# Patient Record
Sex: Male | Born: 1995 | Race: Black or African American | Hispanic: No | Marital: Single | Smoking: Current some day smoker
Health system: Southern US, Community
[De-identification: ages and names within clinical notes are randomized; demographics above are authoritative.]

---

## 2016-08-27 ENCOUNTER — Encounter (HOSPITAL_BASED_OUTPATIENT_CLINIC_OR_DEPARTMENT_OTHER): Payer: Self-pay | Admitting: *Deleted

## 2016-08-27 ENCOUNTER — Emergency Department (HOSPITAL_BASED_OUTPATIENT_CLINIC_OR_DEPARTMENT_OTHER)
Admission: EM | Admit: 2016-08-27 | Discharge: 2016-08-27 | Disposition: A | Discharge status: Home / Self Care | Payer: No Typology Code available for payment source | Attending: Emergency Medicine | Admitting: Emergency Medicine

## 2016-08-27 DIAGNOSIS — Y92410 Unspecified street and highway as the place of occurrence of the external cause: Secondary | ICD-10-CM | POA: Insufficient documentation

## 2016-08-27 DIAGNOSIS — F17200 Nicotine dependence, unspecified, uncomplicated: Secondary | ICD-10-CM | POA: Diagnosis not present

## 2016-08-27 DIAGNOSIS — Y939 Activity, unspecified: Secondary | ICD-10-CM | POA: Insufficient documentation

## 2016-08-27 DIAGNOSIS — Y999 Unspecified external cause status: Secondary | ICD-10-CM | POA: Insufficient documentation

## 2016-08-27 DIAGNOSIS — Z041 Encounter for examination and observation following transport accident: Secondary | ICD-10-CM | POA: Insufficient documentation
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
## 2018-02-05 ENCOUNTER — Encounter (HOSPITAL_COMMUNITY): Payer: Self-pay | Admitting: Emergency Medicine

## 2018-02-05 ENCOUNTER — Emergency Department (HOSPITAL_COMMUNITY): Payer: Self-pay

## 2018-02-05 ENCOUNTER — Other Ambulatory Visit: Payer: Self-pay

## 2018-02-05 ENCOUNTER — Emergency Department (HOSPITAL_COMMUNITY)
Admission: EM | Admit: 2018-02-05 | Discharge: 2018-02-05 | Disposition: A | Discharge status: Home / Self Care | Payer: Self-pay | Attending: Emergency Medicine | Admitting: Emergency Medicine

## 2018-02-05 DIAGNOSIS — J069 Acute upper respiratory infection, unspecified: Secondary | ICD-10-CM | POA: Insufficient documentation

## 2018-02-05 DIAGNOSIS — F17210 Nicotine dependence, cigarettes, uncomplicated: Secondary | ICD-10-CM | POA: Insufficient documentation

## 2018-02-05 LAB — GROUP A STREP BY PCR: Group A Strep by PCR: NOT DETECTED

## 2018-02-05 MED ORDER — IBUPROFEN 800 MG PO TABS
800.0000 mg | ORAL_TABLET | Freq: Once | ORAL | Status: AC
  Administered 2018-02-05: 800 mg via ORAL
  Filled 2018-02-05: qty 1

## 2018-02-05 NOTE — ED Provider Notes (Signed)
[facility]-EMERGENCY DEPT Provider Note   CSN: [id number] Arrival date & time: 02/05/18  1049     History   Chief Complaint Chief Complaint  Patient presents with  . Cough  . Sore Throat  . Shortness of Breath    HPI Darren Holland is a 22 y.o. male.  The history is provided by the patient.  URI   This is a new problem. The current episode started more than 2 days ago. The problem has not changed since onset.There has been no fever. The fever has been present for less than 1 day. Associated symptoms include congestion, sinus pain, sore throat and cough. Pertinent negatives include no chest pain, no abdominal pain, no diarrhea, no nausea, no vomiting, no dysuria, no ear pain, no rhinorrhea, no joint swelling, no neck pain, no rash and no wheezing. Treatments tried: cough medicine. The treatment provided mild relief.    History reviewed. No pertinent past medical history.  There are no active problems to display for this patient.   History reviewed. No pertinent surgical history.      Home Medications    Prior to Admission medications   Not on File    Family History No family history on file.  Social History Social History   Tobacco Use  . Smoking status: Current Every Day Smoker  . Smokeless tobacco: Never Used  Substance Use Topics  . Alcohol use: No  . Drug use: Not on file     Allergies   Patient has no known allergies.   Review of Systems Review of Systems  Constitutional: Negative for chills and fever.  HENT: Positive for congestion, sinus pressure, sinus pain and sore throat. Negative for ear pain, rhinorrhea, trouble swallowing and voice change.   Eyes: Negative for pain and visual disturbance.  Respiratory: Positive for cough. Negative for shortness of breath and wheezing.   Cardiovascular: Negative for chest pain and palpitations.  Gastrointestinal: Negative for abdominal pain, diarrhea, nausea and vomiting.    Genitourinary: Negative for dysuria and hematuria.  Musculoskeletal: Negative for arthralgias, back pain and neck pain.  Skin: Negative for color change and rash.  Neurological: Negative for seizures and syncope.  All other systems reviewed and are negative.    Physical Exam Updated Vital Signs  ED Triage Vitals  Enc Vitals Group     BP 02/05/18 1052 (!) 157/81     Pulse Rate 02/05/18 1052 67     Resp 02/05/18 1052 15     Temp 02/05/18 1052 97.8 F (36.6 C)     Temp Source 02/05/18 1052 Oral     SpO2 02/05/18 1052 97 %     Weight 02/05/18 1053 240 lb (108.9 kg)     Height 02/05/18 1053 5\' 11"  (1.803 m)     Head Circumference --      Peak Flow --      Pain Score 02/05/18 1056 0     Pain Loc --      Pain Edu? --      Excl. in GC? --     Physical Exam  Constitutional: He appears well-developed and well-nourished.  HENT:  Head: Normocephalic and atraumatic.  Right Ear: Tympanic membrane normal.  Left Ear: Tympanic membrane normal.  Mouth/Throat: Uvula is midline and mucous membranes are normal. Posterior oropharyngeal erythema present. No oropharyngeal exudate, posterior oropharyngeal edema or tonsillar abscesses. No tonsillar exudate.  Eyes: Pupils are equal, round, and reactive to light. Conjunctivae and EOM are normal.  Neck:  Neck supple.  Cardiovascular: Normal rate, regular rhythm, normal heart sounds and intact distal pulses.  No murmur heard. Pulmonary/Chest: Effort normal and breath sounds normal. No respiratory distress.  Abdominal: Soft. Bowel sounds are normal. There is no tenderness.  Musculoskeletal: He exhibits no edema.  Neurological: He is alert.  Skin: Skin is warm and dry. Capillary refill takes less than 2 seconds.  Psychiatric: He has a normal mood and affect.  Nursing note and vitals reviewed.    ED Treatments / Results  Labs (all labs ordered are listed, but only abnormal results are displayed) Labs Reviewed  GROUP A STREP BY PCR     EKG None  Radiology Dg Chest 2 View  Result Date: 02/05/2018 CLINICAL DATA:  Cough and sore throat.  Shortness of breath. EXAM: CHEST - 2 VIEW COMPARISON:  None. FINDINGS: Lungs are clear. Heart size and pulmonary vascularity are normal. No adenopathy. No bone lesions. IMPRESSION: No edema or consolidation. Electronically Signed   By: Bretta Bang III M.D.   On: 02/05/2018 11:48    Procedures Procedures (including critical care time)  Medications Ordered in ED Medications  ibuprofen (ADVIL,MOTRIN) tablet 800 mg (800 mg Oral Given 02/05/18 1110)     Initial Impression / Assessment and Plan / ED Course  I have reviewed the triage vital signs and the nursing notes.  Pertinent labs & imaging results that were available during my care of the patient were reviewed by me and considered in my medical decision making (see chart for details).     Darren Holland is a 22 year old male with no significant medical history who presents to the ED with URI type symptoms.  Patient with normal vitals.  No fever.  Patient with cough, congestion for the last several days.  Has had a sore throat.  Denies any ear pain, abdominal pain, urinary symptoms.  Has felt feverish, chills but not currently.  Overall exam is unremarkable.  There is some mild erythema of the posterior oropharynx and will check for strep test.  Lung sounds are clear but given length of symptoms we will test for pneumonia.  Suspect ongoing viral process.  Patient given Tylenol.  Strep negative.  Chest x-ray without any signs of pneumonia, no pneumothorax, no pleural effusion.  Suspect viral process.  Discharged from ED in good condition.  Recommend follow-up with primary care doctor and told to return to the ED if symptoms worsen.  Final Clinical Impressions(s) / ED Diagnoses   Final diagnoses:  Upper respiratory tract infection, unspecified type    ED Discharge Orders    None       Virgina Norfolk, DO 02/05/18  1211

## 2018-02-05 NOTE — ED Notes (Signed)
Pt says "he has something to do" and wants to leave.

## 2018-02-05 NOTE — ED Triage Notes (Signed)
Patient arrives with c/o cough and sore throat x1 week. Patient taking OTC medications, patient states cough gets worse when talking. Denies fever, + productive cough (green), +SOB, +nasal congestion.

## 2018-07-30 ENCOUNTER — Other Ambulatory Visit: Payer: Self-pay

## 2018-07-30 ENCOUNTER — Emergency Department (HOSPITAL_BASED_OUTPATIENT_CLINIC_OR_DEPARTMENT_OTHER): Payer: Self-pay

## 2018-07-30 ENCOUNTER — Encounter (HOSPITAL_BASED_OUTPATIENT_CLINIC_OR_DEPARTMENT_OTHER): Payer: Self-pay | Admitting: *Deleted

## 2018-07-30 ENCOUNTER — Emergency Department (HOSPITAL_BASED_OUTPATIENT_CLINIC_OR_DEPARTMENT_OTHER)
Admission: EM | Admit: 2018-07-30 | Discharge: 2018-07-30 | Disposition: A | Discharge status: Home / Self Care | Payer: Self-pay | Attending: Emergency Medicine | Admitting: Emergency Medicine

## 2018-07-30 DIAGNOSIS — R0789 Other chest pain: Secondary | ICD-10-CM

## 2018-07-30 DIAGNOSIS — Z87891 Personal history of nicotine dependence: Secondary | ICD-10-CM | POA: Insufficient documentation

## 2018-07-30 DIAGNOSIS — R0989 Other specified symptoms and signs involving the circulatory and respiratory systems: Secondary | ICD-10-CM | POA: Insufficient documentation

## 2018-07-30 DIAGNOSIS — R0981 Nasal congestion: Secondary | ICD-10-CM | POA: Insufficient documentation

## 2018-07-30 MED ORDER — CETIRIZINE HCL 10 MG PO TABS: 10.0000 mg | ORAL_TABLET | Freq: Every day | ORAL | 0 refills | Status: AC

## 2018-07-30 NOTE — ED Triage Notes (Signed)
Pt reports he woke with chest pain this morning, felt hot, and also had a "runny nose". States pain is still present, mid-chest, not changed by movement or breathing

## 2018-07-30 NOTE — ED Provider Notes (Signed)
[facility] EMERGENCY DEPARTMENT Provider Note   CSN: [id number] Arrival date & time: 07/30/18  1929    History   Chief Complaint Chief Complaint  Patient presents with  . Chest Pain    HPI Darren Holland is a 23 y.o. male.     The history is provided by the patient. No language interpreter was used.  Chest Pain   Darren Holland is a 23 y.o. male who presents to the Emergency Department complaining of chest pain. He presents to the emergency department for evaluation of runny nose and chest pain. He works third shift and was in his routine state of health when he came home from work this morning. He went to bed at 10 AM and when he awoke at 6 PM he had a runny nose and central chest discomfort that he described as a cold in his chest. He denies any fevers, coughing, shortness of breath, nausea, vomiting, diaphoresis, leg swelling or pain. He states that overall his chest discomfort is gone. He denies any medical problems and takes no medications. He thinks he may have some seasonal allergies. History reviewed. No pertinent past medical history.  There are no active problems to display for this patient.   History reviewed. No pertinent surgical history.      Home Medications    Prior to Admission medications   Medication Sig Start Date End Date Taking? Authorizing Provider  cetirizine (ZYRTEC) 10 MG tablet Take 1 tablet (10 mg total) by mouth daily. 07/30/18   Tilden Fossa, MD    Family History No family history on file.  Social History Social History   Tobacco Use  . Smoking status: Former Games developer  . Smokeless tobacco: Never Used  Substance Use Topics  . Alcohol use: Not Currently  . Drug use: Never     Allergies   Patient has no known allergies.   Review of Systems Review of Systems  Cardiovascular: Positive for chest pain.  All other systems reviewed and are negative.    Physical Exam Updated Vital Signs BP 123/65 (BP Location: Left  Arm)   Pulse 70   Temp 98.4 F (36.9 C) (Oral)   Resp 16   Ht 6' (1.829 m)   Wt 111.1 kg   SpO2 98%   BMI 33.23 kg/m   Physical Exam Vitals signs and nursing note reviewed.  Constitutional:      Appearance: He is well-developed.  HENT:     Head: Normocephalic and atraumatic.     Nose:     Comments: Boggy and edematous nasal mucosa    Mouth/Throat:     Mouth: Mucous membranes are moist.  Cardiovascular:     Rate and Rhythm: Normal rate and regular rhythm.     Heart sounds: No murmur.  Pulmonary:     Effort: Pulmonary effort is normal. No respiratory distress.     Breath sounds: Normal breath sounds.  Abdominal:     Palpations: Abdomen is soft.     Tenderness: There is no abdominal tenderness. There is no guarding or rebound.  Musculoskeletal:        General: No swelling or tenderness.  Skin:    General: Skin is warm and dry.  Neurological:     Mental Status: He is alert and oriented to person, place, and time.  Psychiatric:        Behavior: Behavior normal.      ED Treatments / Results  Labs (all labs ordered are listed, but only abnormal results  are displayed) Labs Reviewed - No data to display  EKG None  Radiology Dg Chest 2 View  Result Date: 07/30/2018 CLINICAL DATA:  Chest pain EXAM: CHEST - 2 VIEW COMPARISON:  02/05/2018 FINDINGS: The heart size and mediastinal contours are within normal limits. Both lungs are clear. The visualized skeletal structures are unremarkable. IMPRESSION: No active cardiopulmonary disease. Electronically Signed   By: Alcide Clever M.D.   On: 07/30/2018 20:34    Procedures Procedures (including critical care time)  Medications Ordered in ED Medications - No data to display   Initial Impression / Assessment and Plan / ED Course  I have reviewed the triage vital signs and the nursing notes.  Pertinent labs & imaging results that were available during my care of the patient were reviewed by me and considered in my medical  decision making (see chart for details).        Patient here for evaluation of runny nose and fleeting episode of chest pain that is now resolved. He is in no acute distress in the emergency department. EKG without acute ischemic changes. Presentation is not consistent with ACS, PE, dissection, pneumonia. He does have nasal edema, question seasonal allergies. Will start on Zyrtec. Discussed outpatient follow-up and return precautions. Final Clinical Impressions(s) / ED Diagnoses   Final diagnoses:  Nasal congestion  Atypical chest pain    ED Discharge Orders         Ordered    cetirizine (ZYRTEC) 10 MG tablet  Daily     07/30/18 2053           Tilden Fossa, MD 07/30/18 2126

## 2018-11-14 ENCOUNTER — Emergency Department (HOSPITAL_COMMUNITY): Payer: Self-pay

## 2018-11-14 ENCOUNTER — Other Ambulatory Visit: Payer: Self-pay

## 2018-11-14 ENCOUNTER — Emergency Department (HOSPITAL_COMMUNITY)
Admission: EM | Admit: 2018-11-14 | Discharge: 2018-11-14 | Disposition: A | Discharge status: Home / Self Care | Payer: Self-pay | Attending: Emergency Medicine | Admitting: Emergency Medicine

## 2018-11-14 DIAGNOSIS — M1612 Unilateral primary osteoarthritis, left hip: Secondary | ICD-10-CM | POA: Insufficient documentation

## 2018-11-14 DIAGNOSIS — Z87891 Personal history of nicotine dependence: Secondary | ICD-10-CM | POA: Insufficient documentation

## 2018-11-14 MED ORDER — IBUPROFEN 400 MG PO TABS
600.0000 mg | ORAL_TABLET | Freq: Once | ORAL | Status: AC
  Administered 2018-11-14: 600 mg via ORAL
  Filled 2018-11-14: qty 1

## 2018-11-14 NOTE — ED Notes (Signed)
Patient verbalizes understanding of discharge instructions. Opportunity for questioning and answering were provided. Armband removed by staff , patient discharged from ED. 

## 2018-11-14 NOTE — ED Notes (Signed)
Patient transported to X-ray 
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
## 2021-03-28 ENCOUNTER — Encounter (HOSPITAL_COMMUNITY): Payer: Self-pay

## 2021-03-28 ENCOUNTER — Other Ambulatory Visit: Payer: Self-pay

## 2021-03-28 ENCOUNTER — Ambulatory Visit (HOSPITAL_COMMUNITY)
Admission: EM | Admit: 2021-03-28 | Discharge: 2021-03-28 | Disposition: A | Discharge status: Home / Self Care | Payer: Self-pay | Attending: Medical Oncology | Admitting: Medical Oncology

## 2021-03-28 DIAGNOSIS — J020 Streptococcal pharyngitis: Secondary | ICD-10-CM

## 2021-03-28 LAB — POCT RAPID STREP A, ED / UC: Streptococcus, Group A Screen (Direct): POSITIVE — AB

## 2021-03-28 MED ORDER — CLINDAMYCIN HCL 300 MG PO CAPS: 300.0000 mg | ORAL_CAPSULE | Freq: Three times a day (TID) | ORAL | 0 refills | Status: AC

## 2021-03-28 NOTE — ED Provider Notes (Signed)
[facility]    CSN: [id number] Arrival date & time: 03/28/21  8366      History   Chief Complaint Chief Complaint  Patient presents with   Sore Throat    HPI Darren Holland is a 25 y.o. male.   HPI  Sore Throat: Pt reports that for the past few hours he has had a sore throat with what he recalls as white patches on his throat. He also reports a fever, headache. Hurts to swallow but is able to and does not have any trouble breathing. No vomiting, rash or cough. He has tried ibuprofen for symptoms without improvement.   History reviewed. No pertinent past medical history.  There are no problems to display for this patient.   History reviewed. No pertinent surgical history.   Home Medications    Prior to Admission medications   Medication Sig Start Date End Date Taking? Authorizing Provider  cetirizine (ZYRTEC) 10 MG tablet Take 1 tablet (10 mg total) by mouth daily. 07/30/18   Tilden Fossa, MD    Family History History reviewed. No pertinent family history.  Social History Social History   Tobacco Use   Smoking status: Former   Smokeless tobacco: Never  Building services engineer Use: Former  Substance Use Topics   Alcohol use: Not Currently   Drug use: Yes    Types: Marijuana    Comment: daily     Allergies   Patient has no known allergies.   Review of Systems Review of Systems  As stated above in HPI Physical Exam Triage Vital Signs ED Triage Vitals  Enc Vitals Group     BP 03/28/21 1132 (!) 162/90     Pulse Rate 03/28/21 1132 79     Resp 03/28/21 1132 18     Temp 03/28/21 1132 99.8 F (37.7 C)     Temp Source 03/28/21 1132 Oral     SpO2 03/28/21 1132 94 %     Weight --      Height --      Head Circumference --      Peak Flow --      Pain Score 03/28/21 1133 10     Pain Loc --      Pain Edu? --      Excl. in GC? --    No data found.  Updated Vital Signs BP (!) 162/90 (BP Location: Right Arm)   Pulse 79   Temp 99.8 F  (37.7 C) (Oral)   Resp 18   SpO2 94%   Physical Exam Vitals and nursing note reviewed.  Constitutional:      General: He is not in acute distress.    Appearance: He is well-developed. He is not ill-appearing, toxic-appearing or diaphoretic.  HENT:     Head: Normocephalic and atraumatic.     Right Ear: Tympanic membrane normal. No middle ear effusion. Tympanic membrane is not erythematous.     Left Ear: Tympanic membrane normal.  No middle ear effusion. Tympanic membrane is not erythematous.     Nose: No congestion or rhinorrhea.     Mouth/Throat:     Mouth: Mucous membranes are moist.     Pharynx: Oropharynx is clear. Uvula midline. No oropharyngeal exudate, posterior oropharyngeal erythema or uvula swelling.     Tonsils: Tonsillar exudate and tonsillar abscess (possible left) present. 2+ on the right. 2+ on the left.  Eyes:     Conjunctiva/sclera: Conjunctivae normal.     Pupils: Pupils are equal,  round, and reactive to light.  Cardiovascular:     Rate and Rhythm: Normal rate and regular rhythm.     Heart sounds: Normal heart sounds.  Pulmonary:     Effort: Pulmonary effort is normal.     Breath sounds: Normal breath sounds.  Musculoskeletal:     Cervical back: Normal range of motion and neck supple.  Lymphadenopathy:     Cervical: Cervical adenopathy present.  Skin:    General: Skin is warm.  Neurological:     Mental Status: He is alert and oriented to person, place, and time.     UC Treatments / Results  Labs (all labs ordered are listed, but only abnormal results are displayed) Labs Reviewed - No data to display  EKG   Radiology No results found.  Procedures Procedures (including critical care time)  Medications Ordered in UC Medications - No data to display  Initial Impression / Assessment and Plan / UC Course  I have reviewed the triage vital signs and the nursing notes.  Pertinent labs & imaging results that were available during my care of the  patient were reviewed by me and considered in my medical decision making (see chart for details).     New.  I discussed with Dr. Jearld Fenton who is on-call as I do have some concerns that he may develop peritonsillar abscess especially on the left side.  Dr. Lorri Frederick suggested clindamycin and a 24-hour trial as long as he is not having any trouble breathing and no evidence of compromised airway.  Very low threshold for going to the emergency room.  Final Clinical Impressions(s) / UC Diagnoses   Final diagnoses:  None   Discharge Instructions   None    ED Prescriptions   None    PDMP not reviewed this encounter.   Rushie Chestnut, [place] 03/28/21 1241

## 2021-03-28 NOTE — ED Triage Notes (Signed)
Pt c/o sore throat with white patches and fever. Sx started last pm.

## 2021-03-28 NOTE — Discharge Instructions (Addendum)
If you have any trouble breathing, swallowing or you cannot tolerate your medication please go directly to the ER or call 911

## 2021-04-06 ENCOUNTER — Emergency Department (HOSPITAL_COMMUNITY): Payer: Self-pay

## 2021-04-06 ENCOUNTER — Encounter (HOSPITAL_COMMUNITY): Payer: Self-pay | Admitting: Emergency Medicine

## 2021-04-06 ENCOUNTER — Other Ambulatory Visit: Payer: Self-pay

## 2021-04-06 ENCOUNTER — Emergency Department (HOSPITAL_COMMUNITY)
Admission: EM | Admit: 2021-04-06 | Discharge: 2021-04-07 | Disposition: A | Discharge status: Home / Self Care | Payer: Self-pay | Attending: Emergency Medicine | Admitting: Emergency Medicine

## 2021-04-06 DIAGNOSIS — S4991XA Unspecified injury of right shoulder and upper arm, initial encounter: Secondary | ICD-10-CM | POA: Insufficient documentation

## 2021-04-06 DIAGNOSIS — Z87891 Personal history of nicotine dependence: Secondary | ICD-10-CM | POA: Insufficient documentation

## 2021-04-06 DIAGNOSIS — E669 Obesity, unspecified: Secondary | ICD-10-CM | POA: Insufficient documentation

## 2021-04-06 DIAGNOSIS — S199XXA Unspecified injury of neck, initial encounter: Secondary | ICD-10-CM | POA: Insufficient documentation

## 2021-04-06 DIAGNOSIS — S4992XA Unspecified injury of left shoulder and upper arm, initial encounter: Secondary | ICD-10-CM | POA: Insufficient documentation

## 2021-04-06 DIAGNOSIS — S3992XA Unspecified injury of lower back, initial encounter: Secondary | ICD-10-CM | POA: Insufficient documentation

## 2021-04-06 DIAGNOSIS — Y92410 Unspecified street and highway as the place of occurrence of the external cause: Secondary | ICD-10-CM | POA: Insufficient documentation
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
## 2021-04-07 MED ORDER — METHOCARBAMOL 500 MG PO TABS: 500.0000 mg | ORAL_TABLET | Freq: Two times a day (BID) | ORAL | 0 refills | Status: DC

## 2021-04-07 MED ORDER — IBUPROFEN 800 MG PO TABS: 800.0000 mg | ORAL_TABLET | Freq: Three times a day (TID) | ORAL | 0 refills | Status: AC
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
## 2021-04-09 ENCOUNTER — Encounter: Payer: Self-pay | Admitting: Emergency Medicine

## 2021-04-09 ENCOUNTER — Other Ambulatory Visit: Payer: Self-pay

## 2021-04-09 ENCOUNTER — Ambulatory Visit
Admission: EM | Admit: 2021-04-09 | Discharge: 2021-04-09 | Disposition: A | Discharge status: Home / Self Care | Payer: Self-pay

## 2021-04-09 DIAGNOSIS — M545 Low back pain, unspecified: Secondary | ICD-10-CM
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
## 2022-02-23 ENCOUNTER — Ambulatory Visit
Admission: EM | Admit: 2022-02-23 | Discharge: 2022-02-23 | Disposition: A | Discharge status: Home / Self Care | Payer: Self-pay | Attending: Internal Medicine | Admitting: Internal Medicine

## 2022-02-23 DIAGNOSIS — J029 Acute pharyngitis, unspecified: Secondary | ICD-10-CM | POA: Insufficient documentation

## 2022-02-23 DIAGNOSIS — J039 Acute tonsillitis, unspecified: Secondary | ICD-10-CM | POA: Insufficient documentation

## 2022-02-23 DIAGNOSIS — K047 Periapical abscess without sinus: Secondary | ICD-10-CM | POA: Insufficient documentation

## 2022-02-23 LAB — POCT RAPID STREP A (OFFICE): Rapid Strep A Screen: NEGATIVE

## 2022-02-23 MED ORDER — AMOXICILLIN-POT CLAVULANATE 875-125 MG PO TABS: 1.0000 | ORAL_TABLET | Freq: Two times a day (BID) | ORAL | 0 refills | Status: DC

## 2022-02-23 NOTE — ED Triage Notes (Signed)
Pt presents for eval of gingivitis.  Reports after brushing teeth for the last week his gums are bleeding and feels like they are separating from teeth.  Reports abscess on roof of mouth, in back, x 4 days with swollen lymph nodes.  No fevers.

## 2022-02-23 NOTE — ED Provider Notes (Signed)
[facility]    CSN: [id number] Arrival date & time: 02/23/22  5361      History   Chief Complaint Chief Complaint  Patient presents with   Dental Problem    HPI Darren Holland is a 26 y.o. male.   Patient presents with dental pain, sore throat, swollen lymph nodes.  Patient reports that he is having dental pain with majority being in the front bottom teeth that has been present for multiple days.  He also started experiencing sore throat and possible enlarged tonsils about 4 days ago as well.  Right sided swollen lymph node started around the same time as sore throat.  He denies any fever, nasal congestion, cough.  Denies any known sick contacts.  He has not taken any medications for symptoms.  He reports that he has attempted to call dentist with no success.     History reviewed. No pertinent past medical history.  There are no problems to display for this patient.   History reviewed. No pertinent surgical history.     Home Medications    Prior to Admission medications   Medication Sig Start Date End Date Taking? Authorizing Provider  amoxicillin-clavulanate (AUGMENTIN) 875-125 MG tablet Take 1 tablet by mouth every 12 (twelve) hours. 02/23/22  Yes Laniesha Das, Acie Fredrickson, FNP  cetirizine (ZYRTEC) 10 MG tablet Take 1 tablet (10 mg total) by mouth daily. 07/30/18   Tilden Fossa, MD  ibuprofen (ADVIL) 800 MG tablet Take 1 tablet (800 mg total) by mouth 3 (three) times daily. 04/07/21   Smoot, Shawn Route, PA-C  methocarbamol (ROBAXIN) 500 MG tablet Take 1 tablet (500 mg total) by mouth 2 (two) times daily. 04/07/21   Smoot, Shawn Route, PA-C    Family History History reviewed. No pertinent family history.  Social History Social History   Tobacco Use   Smoking status: Some Days    Types: Cigarettes   Smokeless tobacco: Never  Vaping Use   Vaping Use: Former  Substance Use Topics   Alcohol use: Not Currently   Drug use: Yes    Types: Marijuana    Comment:  daily     Allergies   Patient has no known allergies.   Review of Systems Review of Systems Per HPI  Physical Exam Triage Vital Signs ED Triage Vitals  Enc Vitals Group     BP 02/23/22 0944 (!) 148/92     Pulse Rate 02/23/22 0944 78     Resp 02/23/22 0944 18     Temp 02/23/22 0944 98.7 F (37.1 C)     Temp Source 02/23/22 0944 Oral     SpO2 02/23/22 0944 96 %     Weight --      Height --      Head Circumference --      Peak Flow --      Pain Score 02/23/22 0941 9     Pain Loc --      Pain Edu? --      Excl. in GC? --    No data found.  Updated Vital Signs BP (!) 148/92 (BP Location: Left Arm)   Pulse 78   Temp 98.7 F (37.1 C) (Oral)   Resp 18   SpO2 96%   Visual Acuity Right Eye Distance:   Left Eye Distance:   Bilateral Distance:    Right Eye Near:   Left Eye Near:    Bilateral Near:     Physical Exam Constitutional:      General:  He is not in acute distress.    Appearance: Normal appearance. He is not toxic-appearing or diaphoretic.  HENT:     Head: Normocephalic and atraumatic.     Right Ear: Tympanic membrane and ear canal normal.     Left Ear: Tympanic membrane and ear canal normal.     Nose: No congestion.     Mouth/Throat:     Mouth: Mucous membranes are moist.     Dentition: Dental tenderness and gingival swelling present.     Pharynx: Oropharyngeal exudate and posterior oropharyngeal erythema present.     Tonsils: Tonsillar exudate present. 2+ on the right. 2+ on the left.      Comments: Patient has gingival swelling that is mild throughout but also has erythema and more pronounced gingival swelling in front bottom teeth. Eyes:     Extraocular Movements: Extraocular movements intact.     Conjunctiva/sclera: Conjunctivae normal.     Pupils: Pupils are equal, round, and reactive to light.  Cardiovascular:     Rate and Rhythm: Normal rate and regular rhythm.     Pulses: Normal pulses.     Heart sounds: Normal heart sounds.  Pulmonary:      Effort: Pulmonary effort is normal. No respiratory distress.     Breath sounds: Normal breath sounds. No stridor. No wheezing, rhonchi or rales.  Abdominal:     General: Abdomen is flat. Bowel sounds are normal.     Palpations: Abdomen is soft.  Musculoskeletal:        General: Normal range of motion.     Cervical back: Normal range of motion.  Lymphadenopathy:     Cervical: Cervical adenopathy present.     Right cervical: Superficial cervical adenopathy present.  Skin:    General: Skin is warm and dry.  Neurological:     General: No focal deficit present.     Mental Status: He is alert and oriented to person, place, and time. Mental status is at baseline.  Psychiatric:        Mood and Affect: Mood normal.        Behavior: Behavior normal.      UC Treatments / Results  Labs (all labs ordered are listed, but only abnormal results are displayed) Labs Reviewed  CULTURE, GROUP A STREP [facility])  POCT RAPID STREP A (OFFICE)    EKG   Radiology No results found.  Procedures Procedures (including critical care time)  Medications Ordered in UC Medications - No data to display  Initial Impression / Assessment and Plan / UC Course  I have reviewed the triage vital signs and the nursing notes.  Pertinent labs & imaging results that were available during my care of the patient were reviewed by me and considered in my medical decision making (see chart for details).     Patient has a dental infection.  Patient also has acute tonsillitis but rapid strep test was negative.  I am still highly suspicious for strep throat given acute tonsillitis and exudate noted on exam.   Therefore, will opt to treat with Augmentin antibiotic as this will cover for dental infection and acute tonsillitis.  There is low suspicion for mono testing so this testing was deferred. No signs of peritonsillar abscess on exam. Patient provided with dental resources paperwork and advised to follow-up with  dentist for further evaluation and management of dental infection as well.  He was advised of supportive care and symptom management.  Discussed return precautions.  Patient verbalized understanding and was  agreeable with plan. Final Clinical Impressions(s) / UC Diagnoses   Final diagnoses:  Acute tonsillitis, unspecified etiology  Sore throat  Dental infection     Discharge Instructions      I have prescribed you an antibiotic for enlarged tonsils and dental infection.  Please take this medication with food.  Follow-up with dentist for further evaluation and management.    ED Prescriptions     Medication Sig Dispense Auth. Provider   amoxicillin-clavulanate (AUGMENTIN) 875-125 MG tablet Take 1 tablet by mouth every 12 (twelve) hours. 14 tablet [place], Acie Fredrickson, [place]      PDMP not reviewed this encounter.   Gustavus Bryant, [place] 02/23/22 1018

## 2022-02-23 NOTE — Discharge Instructions (Signed)
I have prescribed you an antibiotic for enlarged tonsils and dental infection.  Please take this medication with food.  Follow-up with dentist for further evaluation and management.

## 2022-02-24 LAB — CULTURE, GROUP A STREP (THRC)
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
## 2023-04-30 ENCOUNTER — Ambulatory Visit
Admission: EM | Admit: 2023-04-30 | Discharge: 2023-04-30 | Disposition: A | Discharge status: Home / Self Care | Payer: 59 | Attending: Family Medicine | Admitting: Family Medicine

## 2023-04-30 DIAGNOSIS — Z113 Encounter for screening for infections with a predominantly sexual mode of transmission: Secondary | ICD-10-CM | POA: Diagnosis not present

## 2023-04-30 DIAGNOSIS — Z202 Contact with and (suspected) exposure to infections with a predominantly sexual mode of transmission: Secondary | ICD-10-CM | POA: Diagnosis not present

## 2023-04-30 NOTE — ED Provider Notes (Signed)
[facility]    CSN: [id number] Arrival date & time: 04/30/23  1346      History   Chief Complaint Chief Complaint  Patient presents with   SEXUALLY TRANSMITTED DISEASE    Testing    HPI Darren Holland is a 27 y.o. male.   Patient is here for STD testing.  He had unprotected intercourse recently, and was told his partner may have an STD.  He is not having any symptoms currently.  He declinse blood work today.        History reviewed. No pertinent past medical history.  There are no active problems to display for this patient.   History reviewed. No pertinent surgical history.     Home Medications    Prior to Admission medications   Medication Sig Start Date End Date Taking? Authorizing Provider  amoxicillin-clavulanate (AUGMENTIN) 875-125 MG tablet Take 1 tablet by mouth every 12 (twelve) hours. 02/23/22   Gustavus Bryant, FNP  cetirizine (ZYRTEC) 10 MG tablet Take 1 tablet (10 mg total) by mouth daily. 07/30/18   Tilden Fossa, MD  ibuprofen (ADVIL) 800 MG tablet Take 1 tablet (800 mg total) by mouth 3 (three) times daily. 04/07/21   Smoot, Shawn Route, PA-C  methocarbamol (ROBAXIN) 500 MG tablet Take 1 tablet (500 mg total) by mouth 2 (two) times daily. 04/07/21   Smoot, Shawn Route, PA-C    Family History History reviewed. No pertinent family history.  Social History Social History   Tobacco Use   Smoking status: Some Days    Types: Cigarettes   Smokeless tobacco: Never  Vaping Use   Vaping status: Former  Substance Use Topics   Alcohol use: Yes    Comment: Occassionally.   Drug use: Yes    Types: Marijuana    Comment: daily     Allergies   Patient has no known allergies.   Review of Systems Review of Systems  Constitutional: Negative.   HENT: Negative.    Respiratory: Negative.    Cardiovascular: Negative.   Gastrointestinal: Negative.   Genitourinary: Negative.   Musculoskeletal: Negative.      Physical Exam Triage Vital  Signs ED Triage Vitals  Encounter Vitals Group     BP --      Systolic BP Percentile --      Diastolic BP Percentile --      Pulse --      Resp --      Temp --      Temp src --      SpO2 --      Weight 04/30/23 1544 264 lb 15.9 oz (120.2 kg)     Height 04/30/23 1544 6' (1.829 m)     Head Circumference --      Peak Flow --      Pain Score 04/30/23 1540 0     Pain Loc --      Pain Education --      Exclude from Growth Chart --    No data found.  Updated Vital Signs Ht 6' (1.829 m)   Wt 120.2 kg   BMI 35.94 kg/m   Visual Acuity Right Eye Distance:   Left Eye Distance:   Bilateral Distance:    Right Eye Near:   Left Eye Near:    Bilateral Near:     Physical Exam Constitutional:      Appearance: Normal appearance.  Cardiovascular:     Rate and Rhythm: Normal rate and regular rhythm.  Pulmonary:  Effort: Pulmonary effort is normal.     Breath sounds: Normal breath sounds.  Neurological:     General: No focal deficit present.     Mental Status: He is alert.  Psychiatric:        Mood and Affect: Mood normal.      UC Treatments / Results  Labs (all labs ordered are listed, but only abnormal results are displayed) Labs Reviewed  CYTOLOGY, (ORAL, ANAL, URETHRAL) ANCILLARY ONLY    EKG   Radiology No results found.  Procedures Procedures (including critical care time)  Medications Ordered in UC Medications - No data to display  Initial Impression / Assessment and Plan / UC Course  I have reviewed the triage vital signs and the nursing notes.  Pertinent labs & imaging results that were available during my care of the patient were reviewed by me and considered in my medical decision making (see chart for details).   Final Clinical Impressions(s) / UC Diagnoses   Final diagnoses:  Screening for STD (sexually transmitted disease)     Discharge Instructions      You were seen today for STD testing.  Your swab will be resulted tomorrow and if  positive for anything you will be notified.  If you see something over the weekend then please call for any questions.  Avoid intercourse until all results are completed.     ED Prescriptions   None    PDMP not reviewed this encounter.   Jannifer Franklin, MD 04/30/23 1600

## 2023-04-30 NOTE — Discharge Instructions (Signed)
You were seen today for STD testing.  Your swab will be resulted tomorrow and if positive for anything you will be notified.  If you see something over the weekend then please call for any questions.  Avoid intercourse until all results are completed.

## 2023-04-30 NOTE — ED Triage Notes (Signed)
"  About 2 wks ago I slept with this girl, after I slept with her I started hearing rumors that she has or had STI's". "I currently have no symptoms of concern but want testing". No blood work needed.

## 2023-05-03 ENCOUNTER — Telehealth (HOSPITAL_COMMUNITY): Payer: Self-pay

## 2023-05-03 MED ORDER — METRONIDAZOLE 500 MG PO TABS: 2000.0000 mg | ORAL_TABLET | Freq: Once | ORAL | 0 refills | Status: AC

## 2023-05-03 NOTE — Telephone Encounter (Signed)
Per protocol, pt requires tx with metronidazole. Attempted to reach patient x1. Unable to LVM.  Rx sent to pharmacy on file.

## 2023-05-04 LAB — CYTOLOGY, (ORAL, ANAL, URETHRAL) ANCILLARY ONLY
Chlamydia: NEGATIVE
Comment: NEGATIVE
Comment: NEGATIVE
Comment: NORMAL
Neisseria Gonorrhea: NEGATIVE
Trichomonas: POSITIVE — AB

## 2023-05-17 ENCOUNTER — Ambulatory Visit: Payer: 59

## 2023-05-28 ENCOUNTER — Encounter: Payer: Self-pay | Admitting: Emergency Medicine

## 2023-05-28 ENCOUNTER — Ambulatory Visit
Admission: EM | Admit: 2023-05-28 | Discharge: 2023-05-28 | Disposition: A | Discharge status: Home / Self Care | Payer: 59 | Attending: Internal Medicine | Admitting: Internal Medicine

## 2023-05-28 ENCOUNTER — Ambulatory Visit: Admission: EM | Admit: 2023-05-28 | Discharge: 2023-05-28 | Discharge status: Against Medical Advice | Payer: 59

## 2023-05-28 DIAGNOSIS — A599 Trichomoniasis, unspecified: Secondary | ICD-10-CM | POA: Insufficient documentation

## 2023-05-28 DIAGNOSIS — R600 Localized edema: Secondary | ICD-10-CM | POA: Insufficient documentation

## 2023-05-28 DIAGNOSIS — R030 Elevated blood-pressure reading, without diagnosis of hypertension: Secondary | ICD-10-CM | POA: Diagnosis not present

## 2023-05-28 DIAGNOSIS — R8290 Unspecified abnormal findings in urine: Secondary | ICD-10-CM | POA: Insufficient documentation

## 2023-05-28 LAB — POCT URINALYSIS DIP (MANUAL ENTRY)
Bilirubin, UA: NEGATIVE
Blood, UA: NEGATIVE
Glucose, UA: NEGATIVE mg/dL
Ketones, POC UA: NEGATIVE mg/dL
Nitrite, UA: NEGATIVE
Protein Ur, POC: NEGATIVE mg/dL
Spec Grav, UA: >=1.03 — AB (ref 1.010–1.025)
Urobilinogen, UA: 1 U/dL
pH, UA: 6 (ref 5.0–8.0)

## 2023-05-28 MED ORDER — TRIAMTERENE-HCTZ 37.5-25 MG PO TABS: 1.0000 | ORAL_TABLET | Freq: Every day | ORAL | 0 refills | Status: AC

## 2023-05-28 NOTE — ED Provider Notes (Addendum)
[facility]    CSN: [id number] Arrival date & time: 05/28/23  1755      History   Chief Complaint Chief Complaint  Patient presents with   Leg Pain   SEXUALLY TRANSMITTED DISEASE    Testing    HPI Darren Holland is a 28 y.o. male who presents requesting re testing for trich. Has noticed strong urine odor just as he had it before. He denies dysuria.  2- He woke up this am with both his legs swollen. States that he fell asleep when he was playing video games on his recliner and his legs were hanging. Denies leg pain. He does admits he snores. Does not have a PCP    History reviewed. No pertinent past medical history.  There are no active problems to display for this patient.   History reviewed. No pertinent surgical history.     Home Medications    Prior to Admission medications   Medication Sig Start Date End Date Taking? Authorizing Provider  triamterene-hydrochlorothiazide (MAXZIDE-25) 37.5-25 MG tablet Take 1 tablet by mouth daily. For edema 05/28/23  Yes Rodriguez-Southworth, Nettie Elm, PA-C  cetirizine (ZYRTEC) 10 MG tablet Take 1 tablet (10 mg total) by mouth daily. 07/30/18   Tilden Fossa, MD  ibuprofen (ADVIL) 800 MG tablet Take 1 tablet (800 mg total) by mouth 3 (three) times daily. 04/07/21   Smoot, Shawn Route, PA-C    Family History History reviewed. No pertinent family history.  Social History Social History   Tobacco Use   Smoking status: Some Days    Types: Cigarettes   Smokeless tobacco: Never  Vaping Use   Vaping status: Former  Substance Use Topics   Alcohol use: Yes    Comment: Occassionally.   Drug use: Yes    Types: Marijuana    Comment: daily     Allergies   Patient has no known allergies.   Review of Systems Review of Systems As noted in HPI  Physical Exam Triage Vital Signs ED Triage Vitals  Encounter Vitals Group     BP 05/28/23 1940 (!) 162/71     Systolic BP Percentile --      Diastolic BP Percentile --       Pulse Rate 05/28/23 1940 60     Resp 05/28/23 1940 18     Temp 05/28/23 1940 98 F (36.7 C)     Temp Source 05/28/23 1940 Oral     SpO2 05/28/23 1940 97 %     Weight 05/28/23 1939 280 lb (127 kg)     Height 05/28/23 1939 5\' 11"  (1.803 m)     Head Circumference --      Peak Flow --      Pain Score 05/28/23 1935 7     Pain Loc --      Pain Education --      Exclude from Growth Chart --    No data found.  Updated Vital Signs BP (!) 170/66 (BP Location: Right Arm)   Pulse 63   Temp 98 F (36.7 C) (Oral)   Resp 18   Ht 5\' 11"  (1.803 m)   Wt 280 lb (127 kg)   SpO2 97%   BMI 39.05 kg/m   Visual Acuity Right Eye Distance:   Left Eye Distance:   Bilateral Distance:    Right Eye Near:   Left Eye Near:    Bilateral Near:     Physical Exam Vitals and nursing note reviewed.  HENT:  Right Ear: External ear normal.     Left Ear: External ear normal.  Eyes:     General: No scleral icterus.    Conjunctiva/sclera: Conjunctivae normal.  Cardiovascular:     Rate and Rhythm: Normal rate and regular rhythm.     Heart sounds: No murmur heard. Pulmonary:     Effort: Pulmonary effort is normal.     Breath sounds: Normal breath sounds.  Musculoskeletal:        General: Normal range of motion.     Cervical back: Neck supple.  Skin:    General: Skin is warm and dry.     Findings: No erythema or rash.  Neurological:     Mental Status: He is alert.     Gait: Gait normal.  Psychiatric:        Mood and Affect: Mood normal.        Thought Content: Thought content normal.        Judgment: Judgment normal.      UC Treatments / Results  Labs (all labs ordered are listed, but only abnormal results are displayed) Labs Reviewed  POCT URINALYSIS DIP (MANUAL ENTRY) - Abnormal; Notable for the following components:      Result Value   Color, UA other (*)    Clarity, UA cloudy (*)    Spec Grav, UA >=1.030 (*)    Leukocytes, UA Trace (*)    All other components within  normal limits  URINE CULTURE  CYTOLOGY, (ORAL, ANAL, URETHRAL) ANCILLARY ONLY    EKG   Radiology No results found.  Procedures Procedures (including critical care time)  Medications Ordered in UC Medications - No data to display  Initial Impression / Assessment and Plan / UC Course  I have reviewed the triage vital signs and the nursing notes. Pertinent labs results that were available during my care of the patient were reviewed by me and considered in my medical decision making (see chart for details).  Strong urine odor Leg edema more likely from keeping his leg down while sleeping  I placed him on Maxide for a Couple of day. We made him an appointment with PCP here and asked him to make BO diaries to bring to the new PCP. May also need sleep test.  We will call him with when results come back.   Final Clinical Impressions(s) / UC Diagnoses   Final diagnoses:  Trichomonas infection  Edema of both lower legs  Bad odor of urine  Elevated blood pressure reading   Discharge Instructions   None    ED Prescriptions     Medication Sig Dispense Auth. Provider   triamterene-hydrochlorothiazide (MAXZIDE-25) 37.5-25 MG tablet Take 1 tablet by mouth daily. For edema 2 tablet Rodriguez-Southworth, Nettie Elm, PA-C      PDMP not reviewed this encounter.   Garey Ham, PA-C 05/28/23 2056    Rodriguez-Southworth, [place], PA-C 05/28/23 2058

## 2023-05-28 NOTE — ED Triage Notes (Addendum)
Pt noticed bilateral leg swelling this morning from feet to calves. Non-pitting edema. No trauma or injury to area. 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 No pertinent medical history. Denies cardiac or kidney issues.   Pt reports being seen on 12/20 and tested positive for Trichomonas. Pt asks if he can have repeat testing. Denies any new exposures but said he noticed his urine smells foul x1 day.

## 2023-05-28 NOTE — ED Notes (Addendum)
Called 3x by Patient Access for patient to come back in to be seen, Room ready.  No Answer.

## 2023-05-28 NOTE — ED Notes (Signed)
Called x2 by Clinical staff (from waiting area and by phone), No Answer.

## 2023-05-28 NOTE — ED Triage Notes (Signed)
Patient is here for STD testing & Leg pain.  "When I got tested recently I was + for STI and today my urine had an odor so I want that testing done again". No dysuria.   "Also, here for my legs swelling (lower legs). No injury.

## 2023-05-31 LAB — URINE CULTURE
Culture: <10000 — AB
Special Requests: NORMAL

## 2023-06-01 LAB — CYTOLOGY, (ORAL, ANAL, URETHRAL) ANCILLARY ONLY
Chlamydia: NEGATIVE
Comment: NEGATIVE
Comment: NEGATIVE
Comment: NORMAL
Neisseria Gonorrhea: NEGATIVE
Trichomonas: NEGATIVE

## 2023-08-13 ENCOUNTER — Ambulatory Visit (INDEPENDENT_AMBULATORY_CARE_PROVIDER_SITE_OTHER): Payer: 59 | Admitting: Family

## 2023-08-13 VITALS — BP 148/83 | HR 54 | Temp 98.4°F | Ht 71.25 in | Wt 275.0 lb

## 2023-08-13 DIAGNOSIS — Z7689 Persons encountering health services in other specified circumstances: Secondary | ICD-10-CM

## 2023-08-13 DIAGNOSIS — Z0000 Encounter for general adult medical examination without abnormal findings: Secondary | ICD-10-CM

## 2023-08-13 DIAGNOSIS — R030 Elevated blood-pressure reading, without diagnosis of hypertension: Secondary | ICD-10-CM

## 2023-08-13 DIAGNOSIS — Z1329 Encounter for screening for other suspected endocrine disorder: Secondary | ICD-10-CM

## 2023-08-13 DIAGNOSIS — Z1159 Encounter for screening for other viral diseases: Secondary | ICD-10-CM

## 2023-08-13 DIAGNOSIS — Z130 Encounter for screening for diseases of the blood and blood-forming organs and certain disorders involving the immune mechanism: Secondary | ICD-10-CM

## 2023-08-13 DIAGNOSIS — Z13220 Encounter for screening for lipoid disorders: Secondary | ICD-10-CM

## 2023-08-13 DIAGNOSIS — Z13228 Encounter for screening for other metabolic disorders: Secondary | ICD-10-CM

## 2023-08-13 DIAGNOSIS — Z114 Encounter for screening for human immunodeficiency virus [HIV]: Secondary | ICD-10-CM

## 2023-08-13 DIAGNOSIS — Z131 Encounter for screening for diabetes mellitus: Secondary | ICD-10-CM

## 2023-08-13 NOTE — Progress Notes (Signed)
 Subjective:    Darren Holland - 28 y.o. male MRN [id number]  Date of birth: 1995/09/29  HPI  Darren Holland is to establish care and annual physical exam.   Current issues and/or concerns: - States he eats salty foods. He does not exercise outside of his normal routine. He does not complain of red flag symptoms such as but not limited to chest pain, shortness of breath, worst headache of life, nausea/vomiting.  - No further issues/concerns for discussion today.   ROS per HPI    Health Maintenance:  Health Maintenance Due  Topic Date Due   Pneumococcal Vaccine 69-51 Years old (1 of 2 - PCV) Never done   HPV VACCINES (1 - Male 3-dose series) Never done   HIV Screening  Never done   Hepatitis C Screening  Never done   DTaP/Tdap/Td (1 - Tdap) Never done   COVID-19 Vaccine (1 - 2024-25 season) Never done     Past Medical History: There are no active problems to display for this patient.     Social History   reports that he has been smoking cigarettes. He has never used smokeless tobacco. He reports current alcohol use. He reports current drug use. Drug: Marijuana.   Family History  family history is not on file.   Medications: reviewed and updated   Objective:   Physical Exam BP (!) 148/83   Pulse (!) 54   Temp 98.4 F (36.9 C) (Oral)   Ht 5' 11.25" (1.81 m)   Wt 275 lb (124.7 kg)   SpO2 96%   BMI 38.09 kg/m   Physical Exam HENT:     Head: Normocephalic and atraumatic.     Right Ear: Tympanic membrane, ear canal and external ear normal.     Left Ear: Tympanic membrane, ear canal and external ear normal.     Nose: Nose normal.     Mouth/Throat:     Mouth: Mucous membranes are moist.     Pharynx: Oropharynx is clear.  Eyes:     Extraocular Movements: Extraocular movements intact.     Conjunctiva/sclera: Conjunctivae normal.     Pupils: Pupils are equal, round, and reactive to light.  Neck:     Thyroid: No thyroid mass, thyromegaly or thyroid tenderness.   Cardiovascular:     Rate and Rhythm: Bradycardia present.     Pulses: Normal pulses.     Heart sounds: Normal heart sounds.  Pulmonary:     Effort: Pulmonary effort is normal.     Breath sounds: Normal breath sounds.  Abdominal:     General: Bowel sounds are normal.     Palpations: Abdomen is soft.  Genitourinary:    Comments: Patient declined. Musculoskeletal:        General: Normal range of motion.     Right shoulder: Normal.     Left shoulder: Normal.     Right upper arm: Normal.     Left upper arm: Normal.     Right elbow: Normal.     Left elbow: Normal.     Right forearm: Normal.     Left forearm: Normal.     Right wrist: Normal.     Left wrist: Normal.     Right hand: Normal.     Left hand: Normal.     Cervical back: Normal, normal range of motion and neck supple.     Thoracic back: Normal.     Lumbar back: Normal.     Right hip: Normal.  Left hip: Normal.     Right upper leg: Normal.     Left upper leg: Normal.     Right knee: Normal.     Left knee: Normal.     Right lower leg: Normal.     Left lower leg: Normal.     Right ankle: Normal.     Left ankle: Normal.     Right foot: Normal.     Left foot: Normal.  Skin:    General: Skin is warm and dry.     Capillary Refill: Capillary refill takes less than 2 seconds.  Neurological:     General: No focal deficit present.     Mental Status: He is alert and oriented to person, place, and time.  Psychiatric:        Mood and Affect: Mood normal.        Behavior: Behavior normal.       Assessment & Plan:  1. Encounter to establish care (Primary) 2. Annual physical exam - Counseled on 150 minutes of exercise per week as tolerated, healthy eating (including decreased daily intake of saturated fats, cholesterol, added sugars, sodium), STI prevention, and routine healthcare maintenance.  3. Screening for metabolic disorder - Routine screening.  - CMP14+EGFR  4. Screening for deficiency anemia - Routine  screening.  - CBC  5. Diabetes mellitus screening - Routine screening.  - Hemoglobin A1c  6. Screening cholesterol level - Routine screening.  - Lipid panel  7. Thyroid disorder screen - Routine screening.  - TSH  8. Encounter for screening for HIV - Routine screening.  - HIV antibody (with reflex)  9. Need for hepatitis C screening test - Routine screening.  - Hepatitis C Antibody  10. Elevated blood pressure reading - Blood pressure not at goal during today's visit. Patient asymptomatic without chest pressure, chest pain, palpitations, shortness of breath, worst headache of life, and any additional red flag symptoms. - Follow-up with primary provider in 4 weeks or sooner if needed.   Patient was given clear instructions to go to Emergency Department or return to medical center if symptoms don't improve, worsen, or new problems develop.The patient verbalized understanding.  I discussed the assessment and treatment plan with the patient. The patient was provided an opportunity to ask questions and all were answered. The patient agreed with the plan and demonstrated an understanding of the instructions.   The patient was advised to call back or seek an in-person evaluation if the symptoms worsen or if the condition fails to improve as anticipated.    Ricky Stabs, NP 08/13/2023, 10:58 AM Primary Care at [facility] [place]

## 2023-08-13 NOTE — Progress Notes (Signed)
 Patient states nothing to discuss.

## 2023-08-14 LAB — CMP14+EGFR
ALT: 36 IU/L (ref 0–44)
AST: 29 IU/L (ref 0–40)
Albumin: 3.8 g/dL — ABNORMAL LOW (ref 4.3–5.2)
Alkaline Phosphatase: 48 IU/L (ref 44–121)
BUN/Creatinine Ratio: 13 (ref 9–20)
BUN: 11 mg/dL (ref 6–20)
Bilirubin Total: 0.4 mg/dL (ref 0.0–1.2)
CO2: 23 mmol/L (ref 20–29)
Calcium: 9.2 mg/dL (ref 8.7–10.2)
Chloride: 103 mmol/L (ref 96–106)
Creatinine, Ser: 0.84 mg/dL (ref 0.76–1.27)
Globulin, Total: 2.8 g/dL (ref 1.5–4.5)
Glucose: 104 mg/dL — ABNORMAL HIGH (ref 70–99)
Potassium: 4.8 mmol/L (ref 3.5–5.2)
Sodium: 139 mmol/L (ref 134–144)
Total Protein: 6.6 g/dL (ref 6.0–8.5)
eGFR: 123 mL/min/{1.73_m2} (ref >59)

## 2023-08-14 LAB — LIPID PANEL
Chol/HDL Ratio: 3.8 ratio (ref 0.0–5.0)
Cholesterol, Total: 169 mg/dL (ref 100–199)
HDL: 45 mg/dL (ref >39)
LDL Chol Calc (NIH): 107 mg/dL — ABNORMAL HIGH (ref 0–99)
Triglycerides: 89 mg/dL (ref 0–149)
VLDL Cholesterol Cal: 17 mg/dL (ref 5–40)

## 2023-08-14 LAB — CBC
Hematocrit: 45.4 % (ref 37.5–51.0)
Hemoglobin: 14.6 g/dL (ref 13.0–17.7)
MCH: 30.1 pg (ref 26.6–33.0)
MCHC: 32.2 g/dL (ref 31.5–35.7)
MCV: 94 fL (ref 79–97)
Platelets: 232 10*3/uL (ref 150–450)
RBC: 4.85 x10E6/uL (ref 4.14–5.80)
RDW: 13.1 % (ref 11.6–15.4)
WBC: 6.7 10*3/uL (ref 3.4–10.8)

## 2023-08-14 LAB — HIV ANTIBODY (ROUTINE TESTING W REFLEX): HIV Screen 4th Generation wRfx: NONREACTIVE

## 2023-08-14 LAB — HEMOGLOBIN A1C
Est. average glucose Bld gHb Est-mCnc: 123 mg/dL
Hgb A1c MFr Bld: 5.9 % — ABNORMAL HIGH (ref 4.8–5.6)

## 2023-08-14 LAB — TSH: TSH: 1.56 u[IU]/mL (ref 0.450–4.500)

## 2023-08-14 LAB — HEPATITIS C ANTIBODY: Hep C Virus Ab: NONREACTIVE

## 2023-08-16 ENCOUNTER — Encounter: Payer: Self-pay | Admitting: Family

## 2023-08-16 ENCOUNTER — Other Ambulatory Visit: Payer: Self-pay | Admitting: Family

## 2023-08-16 DIAGNOSIS — Z13220 Encounter for screening for lipoid disorders: Secondary | ICD-10-CM

## 2023-08-16 NOTE — Progress Notes (Signed)
 I called patient no one answered.  I could not leave a voicemail.  I will try back later.

## 2024-02-09 ENCOUNTER — Telehealth (HOSPITAL_COMMUNITY): Payer: Self-pay

## 2024-02-09 NOTE — Telephone Encounter (Signed)
 Pt's chart was accessed due to pt being on backlogged list of patients in need of PCP placement assistance.  Per chart review, pt has seen established PCP. No further action taken at this time.

## 2024-03-23 ENCOUNTER — Encounter (HOSPITAL_COMMUNITY): Payer: Self-pay

## 2024-03-23 ENCOUNTER — Other Ambulatory Visit: Payer: Self-pay

## 2024-03-23 ENCOUNTER — Emergency Department (HOSPITAL_COMMUNITY)
Admission: EM | Admit: 2024-03-23 | Discharge: 2024-03-23 | Disposition: A | Discharge status: Home / Self Care | Payer: Self-pay

## 2024-03-23 DIAGNOSIS — M5442 Lumbago with sciatica, left side: Secondary | ICD-10-CM | POA: Insufficient documentation

## 2024-03-23 MED ORDER — KETOROLAC TROMETHAMINE 15 MG/ML IJ SOLN
15.0000 mg | Freq: Once | INTRAMUSCULAR | Status: AC
  Administered 2024-03-23: 15 mg via INTRAMUSCULAR
  Filled 2024-03-23: qty 1

## 2024-03-23 MED ORDER — IBUPROFEN 800 MG PO TABS: 800.0000 mg | ORAL_TABLET | Freq: Three times a day (TID) | ORAL | 0 refills | Status: AC

## 2024-03-23 MED ORDER — ACETAMINOPHEN 500 MG PO TABS
1000.0000 mg | ORAL_TABLET | Freq: Once | ORAL | Status: AC
  Administered 2024-03-23: 1000 mg via ORAL
  Filled 2024-03-23: qty 2

## 2024-03-23 MED ORDER — CYCLOBENZAPRINE HCL 10 MG PO TABS: 10.0000 mg | ORAL_TABLET | Freq: Two times a day (BID) | ORAL | 0 refills | Status: AC | PRN

## 2024-03-23 MED ORDER — LIDOCAINE 5 % EX PTCH
1.0000 | MEDICATED_PATCH | CUTANEOUS | Status: DC
  Administered 2024-03-23: 1 via TRANSDERMAL
  Filled 2024-03-23: qty 1

## 2024-03-23 MED ORDER — LIDOCAINE 5 % EX PTCH: 1.0000 | MEDICATED_PATCH | CUTANEOUS | 0 refills | Status: AC

## 2024-03-23 NOTE — ED Triage Notes (Signed)
 Pt arrives via POV. PT reports lower back pain and intermittent numbness to his feet since Tuesday, states he was bending over at work and that is when his back started hurting. PT denies loss of control of bowel or bladder. Pt is AxOx4.

## 2024-03-23 NOTE — ED Provider Notes (Signed)
 [place] [facility]PART[facility] AT [facility] Provider Note   CSN: [id number] Arrival date & time: 03/23/24  9189     Patient presents with: Back Pain   Darren Holland is a 28 y.o. male.    Back Pain   Patient presents with back pain.  Patient is having worsening lower back pain that began on Tuesday.  Happened whenever he was at work.  He states he has had some tingling sensation down both legs but seems to be more prominent down the left leg into the left foot.  No bowel or bladder incontinence.  No saddle anesthesia.  Able to walk but just with pain.  No spine surgery history.  Denies any IV drug abuse.  No fever no chills.  Took maybe some Goody powder yesterday but otherwise has not taken any medicine.     Previous medical history reviewed : Last follow-up with PCP in April 2025.  Establish care   Prior to Admission medications   Medication Sig Start Date End Date Taking? Authorizing Provider  cyclobenzaprine (FLEXERIL) 10 MG tablet Take 1 tablet (10 mg total) by mouth 2 (two) times daily as needed for muscle spasms. 03/23/24  Yes Simon Lavonia SAILOR, MD  ibuprofen  (ADVIL ) 800 MG tablet Take 1 tablet (800 mg total) by mouth 3 (three) times daily. 03/23/24  Yes Simon Lavonia SAILOR, MD  lidocaine (LIDODERM) 5 % Place 1 patch onto the skin daily. Remove & Discard patch within 12 hours or as directed by MD 03/23/24  Yes Simon Lavonia SAILOR, MD  cetirizine  (ZYRTEC ) 10 MG tablet Take 1 tablet (10 mg total) by mouth daily. Patient not taking: Reported on 08/13/2023 07/30/18   Griselda Norris, MD  ibuprofen  (ADVIL ) 800 MG tablet Take 1 tablet (800 mg total) by mouth 3 (three) times daily. Patient not taking: Reported on 08/13/2023 04/07/21   Smoot, Lauraine LABOR, PA-C  triamterene -hydrochlorothiazide (MAXZIDE-25) 37.5-25 MG tablet Take 1 tablet by mouth daily. For edema Patient not taking: Reported on 08/13/2023 05/28/23   Rodriguez-Southworth, Kyra, PA-C    Allergies: Patient has no known  allergies.    Review of Systems  Musculoskeletal:  Positive for back pain.    Updated Vital Signs BP (!) 159/93 (BP Location: Left Arm)   Pulse 60   Temp 98.2 F (36.8 C) (Oral)   Resp 16   SpO2 99%   Physical Exam Vitals and nursing note reviewed.  Constitutional:      General: He is not in acute distress.    Appearance: He is well-developed.  HENT:     Head: Normocephalic and atraumatic.  Eyes:     Conjunctiva/sclera: Conjunctivae normal.  Cardiovascular:     Rate and Rhythm: Normal rate and regular rhythm.     Heart sounds: No murmur heard. Pulmonary:     Effort: Pulmonary effort is normal. No respiratory distress.     Breath sounds: Normal breath sounds.  Abdominal:     Palpations: Abdomen is soft.     Tenderness: There is no abdominal tenderness.  Musculoskeletal:        General: No swelling.       Arms:     Cervical back: Neck supple.  Skin:    General: Skin is warm and dry.     Capillary Refill: Capillary refill takes less than 2 seconds.  Neurological:     Mental Status: He is alert.  Psychiatric:        Mood and Affect: Mood normal.     (  all labs ordered are listed, but only abnormal results are displayed) Labs Reviewed - No data to display  EKG: None  Radiology: No results found.   Procedures   Medications Ordered in the ED  lidocaine (LIDODERM) 5 % 1 patch (1 patch Transdermal Patch Applied 03/23/24 0957)  ketorolac (TORADOL) 15 MG/ML injection 15 mg (15 mg Intramuscular Given 03/23/24 0957)  acetaminophen (TYLENOL) tablet 1,000 mg (1,000 mg Oral Given 03/23/24 0957)                                    Medical Decision Making Risk OTC drugs. Prescription drug management.     HPI:  Patient presents with back pain.  Patient is having worsening lower back pain that began on Tuesday.  Happened whenever he was at work.  He states he has had some tingling sensation down both legs but seems to be more prominent down the left leg into the  left foot.  No bowel or bladder incontinence.  No saddle anesthesia.  Able to walk but just with pain.  No spine surgery history.  Denies any IV drug abuse.  No fever no chills.  Took maybe some Goody powder yesterday but otherwise has not taken any medicine.     Previous medical history reviewed : Last follow-up with PCP in April 2025.  Establish care  MDM:    Upon exam, patient ANO x 3 GCS 15.  Hemodynamically stable.  Pain to palpation lower lumbar/sacral area.  No obvious deformity.  He has 5-5 strength out of bilateral hips bilateral knees and bilateral ankles with both dorsal and plantarflexion.  Sensation intact to my exam.  He does endorse occasional sensory changes mostly on his left side.  Could not reproduce this here on exam.  More consistent with sciatica.  No concerns for cauda equina at this time.  No difficulty with urination.  No indication obtain any kind of MRI imaging.  No IV drug abuse.  No concerns for abscess.   For symptomatic pain control with Toradol, Tylenol, lidocaine patch.  Will then discharge patient and follow-up with PCP as well spine surgery as needed.  Also recommended physical therapy.    Reevaluation:   Upon reexamination, patient hemodynamically stable.  Remains A&O x 3 with GCS 15.  Patient pain better tolerated.  Remains neuro intact.  Patient will follow-up with physical therapy as well as PCP.  If pain continues, he will follow-up with neurosurgery as well.  Strict return precautions for any Bowel /bladder incontinence, saddle anesthesia. Interventions: toradol, lidocaine patch, tylenol    Social Determinant of Health: Denies IV drug use    Disposition and Follow Up: PCP, PT,       Final diagnoses:  Acute midline low back pain with left-sided sciatica    ED Discharge Orders          Ordered    ibuprofen  (ADVIL ) 800 MG tablet  3 times daily        03/23/24 0948    lidocaine (LIDODERM) 5 %  Every 24 hours        03/23/24 0948     cyclobenzaprine (FLEXERIL) 10 MG tablet  2 times daily PRN        03/23/24 0948               Simon Lavonia SAILOR, MD 03/23/24 1015

## 2024-03-23 NOTE — Discharge Instructions (Addendum)
 For pain, you can take 1000 mg of Tylenol or 1 g of Tylenol every 6-8 hours.  Do not exceed more than 4000 mg or 4 g in a 24-hour period.  You can also take ibuprofen  600 to 800 mg every 6-8 hours as well.  Do not take this high-dose ibuprofen  for greater than a week.  Please use the lidocaine patches as well.   I did call in some Flexeril.  Do not take this and drive.  This can make you sleepy.  Do not take this with other sedative medication or alcohol.  Please follow-up with your PCP regarding this especially if does not go away.  I will give you a referral to spine surgery if you continue have pain as well.  Please follow-up with physical therapy.  Is very importantly follow-up with physical therapy.  Back if you have any kind of bowel bladder incontinence as we discussed, numbness of your groin.
